# Patient Record
Sex: Female | Born: 1965 | Race: White | Hispanic: No | Marital: Single
Health system: Southern US, Academic
[De-identification: ages and names within clinical notes are randomized; demographics above are authoritative.]

## PROBLEM LIST (undated history)

## (undated) DIAGNOSIS — C4490 Unspecified malignant neoplasm of skin, unspecified: Secondary | ICD-10-CM

## (undated) DIAGNOSIS — U071 COVID-19: Secondary | ICD-10-CM

## (undated) HISTORY — DX: COVID-19: U07.1

## (undated) HISTORY — DX: Unspecified malignant neoplasm of skin, unspecified: C44.90

---

## 2001-04-26 ENCOUNTER — Emergency Department (HOSPITAL_COMMUNITY): Payer: Self-pay

## 2015-12-14 ENCOUNTER — Ambulatory Visit (HOSPITAL_COMMUNITY): Payer: Self-pay | Admitting: Family

## 2021-08-09 ENCOUNTER — Encounter (INDEPENDENT_AMBULATORY_CARE_PROVIDER_SITE_OTHER): Payer: Self-pay | Admitting: Family Medicine

## 2021-08-09 DIAGNOSIS — R011 Cardiac murmur, unspecified: Secondary | ICD-10-CM | POA: Insufficient documentation

## 2021-08-09 DIAGNOSIS — F419 Anxiety disorder, unspecified: Secondary | ICD-10-CM | POA: Insufficient documentation

## 2021-08-09 DIAGNOSIS — R030 Elevated blood-pressure reading, without diagnosis of hypertension: Secondary | ICD-10-CM | POA: Insufficient documentation

## 2021-08-14 ENCOUNTER — Ambulatory Visit (INDEPENDENT_AMBULATORY_CARE_PROVIDER_SITE_OTHER): Payer: BC Managed Care – PPO | Admitting: Family Medicine

## 2021-08-14 ENCOUNTER — Other Ambulatory Visit: Payer: Self-pay

## 2021-08-14 ENCOUNTER — Encounter (INDEPENDENT_AMBULATORY_CARE_PROVIDER_SITE_OTHER): Payer: Self-pay | Admitting: Family Medicine

## 2021-08-14 VITALS — BP 133/78 | HR 58 | Temp 98.4°F | Ht 63.5 in | Wt 136.0 lb

## 2021-08-14 DIAGNOSIS — F419 Anxiety disorder, unspecified: Secondary | ICD-10-CM

## 2021-08-14 DIAGNOSIS — E785 Hyperlipidemia, unspecified: Secondary | ICD-10-CM

## 2021-08-14 DIAGNOSIS — R5383 Other fatigue: Secondary | ICD-10-CM

## 2021-08-14 DIAGNOSIS — I10 Essential (primary) hypertension: Secondary | ICD-10-CM

## 2021-08-14 NOTE — Progress Notes (Signed)
FAMILY MEDICINE, MEDICAL OFFICE BUILDING  [place] [address]       Name: Jacqueline Butler MRN:  [id number]   Date: 08/14/2021 Age: 56 y.o.          Provider: Daiva Huge, DO    Reason for visit: Medication follow up and Labs Only      History of Present Illness:  Jacqueline Butler is a 56 y.o. female presenting with known hyperlipidemia, anxiety, hypertension.     She was prescribed BuSpar at last visit her anxiety and she feels that this is more related to menopause and home stressors.  So she did not take the medication.      Patient is still taking her blood pressure medication blood pressure today is 133/78 she monitors this at home. She is a [employer] scientist.    She is due for routine labs today.    Historical Data    Past Medical History:  Past Medical History:   Diagnosis Date   . COVID-19    . Skin cancer          Past Surgical History:  Past Surgical History:   Procedure Laterality Date   . HX CESAREAN SECTION      x2         Allergies:  Allergies   Allergen Reactions   . Codeine Mental Status Effect   . Morphine      Medications:  Current Outpatient Medications   Medication Sig   . busPIRone (BUSPAR) 5 mg Oral Tablet Take 1 Tablet (5 mg total) by mouth Once a day (Patient not taking: Reported on 08/14/2021)   . losartan (COZAAR) 25 mg Oral Tablet Take 1 Tablet (25 mg total) by mouth Once a day     Family History:  Family Medical History:     Problem Relation (Age of Onset)    Diabetes type II Father    Hypertension (High Blood Pressure) Mother          Social History:  Social History     Socioeconomic History   . Marital status: Divorced   Occupational History   . Occupation: Home Health Nurse   Tobacco Use   . Smoking status: Never     Passive exposure: Current   . Smokeless tobacco: Never   Vaping Use   . Vaping Use: Never used   Substance and Sexual Activity   . Alcohol use: Yes     Comment: Holiday/Special Occasions   . Drug use: Never           Review of Systems:  Review of Systems    Constitutional: Negative.  Negative for chills, fever and weight loss.   HENT: Negative.  Negative for congestion, ear pain, hearing loss, sinus pain and sore throat.    Eyes: Negative.  Negative for blurred vision, pain and redness.   Respiratory: Negative.  Negative for cough, shortness of breath and wheezing.    Cardiovascular: Negative.  Negative for chest pain and palpitations.   Gastrointestinal: Negative.  Negative for abdominal pain, blood in stool, constipation, diarrhea, heartburn, nausea and vomiting.   Genitourinary: Negative.  Negative for flank pain, frequency, hematuria and urgency.   Musculoskeletal: Negative.  Negative for back pain, joint pain and neck pain.   Skin: Negative.  Negative for itching and rash.   Neurological: Negative.  Negative for speech change, seizures, weakness and headaches.   Endo/Heme/Allergies: Negative.    Psychiatric/Behavioral: Negative.  Physical Exam:  Vital Signs:  Vitals:    08/14/21 0845   BP: 133/78   Pulse: 58   Temp: 36.9 C (98.4 F)   TempSrc: Skin   SpO2: 97%   Weight: 61.7 kg (136 lb)   Height: 1.613 m (5' 3.5")   BMI: 23.76     Physical Exam  Constitutional:       Appearance: Normal appearance. She is normal weight.   HENT:      Head: Normocephalic and atraumatic.      Nose: Nose normal.      Mouth/Throat:      Mouth: Mucous membranes are moist.      Pharynx: Oropharynx is clear.   Eyes:      Extraocular Movements: Extraocular movements intact.      Conjunctiva/sclera: Conjunctivae normal.      Pupils: Pupils are equal, round, and reactive to light.   Cardiovascular:      Rate and Rhythm: Normal rate.      Heart sounds: No murmur heard.  Pulmonary:      Effort: Pulmonary effort is normal. No respiratory distress.      Breath sounds: Normal breath sounds. No wheezing.   Abdominal:      General: Abdomen is flat. Bowel sounds are normal.      Tenderness: There is no abdominal tenderness.   Musculoskeletal:         General: Normal range of motion.       Cervical back: Normal range of motion.   Skin:     General: Skin is warm and dry.      Findings: No lesion or rash.   Neurological:      General: No focal deficit present.      Mental Status: She is alert and oriented to person, place, and time. Mental status is at baseline.      Gait: Gait normal.   Psychiatric:         Mood and Affect: Mood normal.         Behavior: Behavior normal.         Thought Content: Thought content normal.         Judgment: Judgment normal.          Assessment:    ICD-10-CM    1. Anxiety  F41.9       2. Essential hypertension  I10 LIPID PANEL     VITAMIN D 25 TOTAL      3. Fatigue, unspecified type  R53.83 VITAMIN D 25 TOTAL      4. Hyperlipidemia, unspecified hyperlipidemia type  E78.5 LIPID PANEL           Plan:  Orders Placed This Encounter   . LIPID PANEL   . VITAMIN D 25 TOTAL     She had form for work I signed, scanned to chart  Labs ordered today, will contact patient with results.  Continue current meds and management.  Return to clinic 4 months.            Return in about 4 months (around 12/14/2021) for chronic disease management.    Daiva Huge, DO     Portions of this note may be dictated using voice recognition software or a dictation service. Variances in spelling and vocabulary are possible and unintentional. Not all errors are caught/corrected. Please notify the [facility] if any discrepancies are noted or if the meaning of any statement is not clear.

## 2021-10-07 ENCOUNTER — Other Ambulatory Visit (INDEPENDENT_AMBULATORY_CARE_PROVIDER_SITE_OTHER): Payer: Self-pay | Admitting: Family Medicine

## 2021-10-07 MED ORDER — LOSARTAN 25 MG TABLET
25.0000 mg | ORAL_TABLET | Freq: Every day | ORAL | 1 refills | Status: AC
Start: 2021-10-07 — End: ?

## 2021-11-06 ENCOUNTER — Other Ambulatory Visit (HOSPITAL_COMMUNITY): Payer: Self-pay | Admitting: PHYSICIAN ASSISTANT

## 2021-11-06 DIAGNOSIS — Z1231 Encounter for screening mammogram for malignant neoplasm of breast: Secondary | ICD-10-CM

## 2021-11-19 ENCOUNTER — Ambulatory Visit (HOSPITAL_COMMUNITY): Payer: Self-pay

## 2021-12-02 ENCOUNTER — Ambulatory Visit (HOSPITAL_COMMUNITY): Payer: Self-pay

## 2021-12-17 ENCOUNTER — Inpatient Hospital Stay
Admission: RE | Admit: 2021-12-17 | Discharge: 2021-12-17 | Disposition: A | Discharge status: Home / Self Care | Payer: BC Managed Care – PPO | Source: Ambulatory Visit | Attending: PHYSICIAN ASSISTANT | Admitting: PHYSICIAN ASSISTANT

## 2021-12-17 ENCOUNTER — Other Ambulatory Visit: Payer: Self-pay

## 2021-12-17 ENCOUNTER — Encounter (HOSPITAL_COMMUNITY): Payer: Self-pay

## 2021-12-17 ENCOUNTER — Ambulatory Visit (INDEPENDENT_AMBULATORY_CARE_PROVIDER_SITE_OTHER): Payer: BC Managed Care – PPO | Admitting: Family Medicine

## 2021-12-17 ENCOUNTER — Encounter (INDEPENDENT_AMBULATORY_CARE_PROVIDER_SITE_OTHER): Payer: Self-pay | Admitting: Family Medicine

## 2021-12-17 VITALS — BP 129/81 | HR 66 | Temp 98.1°F | Resp 18 | Ht 63.5 in | Wt 124.2 lb

## 2021-12-17 DIAGNOSIS — Z1231 Encounter for screening mammogram for malignant neoplasm of breast: Secondary | ICD-10-CM | POA: Insufficient documentation

## 2021-12-17 DIAGNOSIS — I10 Essential (primary) hypertension: Secondary | ICD-10-CM

## 2021-12-17 NOTE — Progress Notes (Signed)
FAMILY MEDICINE, MEDICAL OFFICE BUILDING  [place] [address]       Name: Jacqueline Butler MRN:  [id number]   Date: 12/17/2021 Age: 56 y.o.          Provider: Daiva Huge, DO    Reason for visit: Follow Up 3 Months (4 month CDM- Patient is wanting to discuss hormone replacement. Patient has been seeing Dr. Theodosia Quay. )      History of Present Illness:  Jacqueline Butler is a 56 y.o. female here today for chronic disease management.     Patient just had full lab panel with Dr.Lingenfelter, will get these results. She's been started on estrogen but insurance didn't approve. She reports BP have been well controlled even with not always taking best care of herself.    Patient has the following specialist  Dr.Lingenfelter    Last colonoscopy/cologuard:   Last mammogram: 7.18.23  Last pap smear/pelvic exam: Dr.Lingenfelter  Historical Data    Past Medical History:  Patient Active Problem List   Diagnosis   . Cardiac murmur   . Anxiety   . Elevated blood pressure reading without diagnosis of hypertension      Past Surgical History:  Past Surgical History:   Procedure Laterality Date   . HX CESAREAN SECTION      x2         Allergies:  Allergies   Allergen Reactions   . Codeine Mental Status Effect   . Morphine      Medications:  Current Outpatient Medications   Medication Sig   . losartan (COZAAR) 25 mg Oral Tablet Take 1 Tablet (25 mg total) by mouth Once a day     Family History:  Family Medical History:     Problem Relation (Age of Onset)    Diabetes type II Father    Hypertension (High Blood Pressure) Mother    No Known Problems Sister, Brother, Maternal Grandmother, Maternal Grandfather, Paternal Grandmother, Paternal 68, Daughter, Son, Maternal Aunt, Maternal Uncle, Paternal 1, Paternal Uncle, Other          Social History:  Social History     Socioeconomic History   . Marital status: Divorced   Occupational History   . Occupation: Home Health Nurse   Tobacco Use   . Smoking  status: Never     Passive exposure: Current   . Smokeless tobacco: Never   Vaping Use   . Vaping Use: Never used   Substance and Sexual Activity   . Alcohol use: Yes     Comment: Holiday/Special Occasions   . Drug use: Never           Review of Systems:  Review of Systems   Constitutional: Negative.  Negative for chills, fever and weight loss.   HENT: Negative.  Negative for congestion, ear pain, hearing loss, sinus pain and sore throat.    Eyes: Negative.  Negative for blurred vision, pain and redness.   Respiratory: Negative.  Negative for cough, shortness of breath and wheezing.    Cardiovascular: Negative.  Negative for chest pain and palpitations.   Gastrointestinal: Negative.  Negative for abdominal pain, blood in stool, constipation, diarrhea, heartburn, nausea and vomiting.   Genitourinary: Negative.  Negative for flank pain, frequency, hematuria and urgency.   Musculoskeletal: Negative.  Negative for back pain, joint pain and neck pain.   Skin: Negative.  Negative for itching and rash.   Neurological: Negative.  Negative for speech change, seizures, weakness and  headaches.   Endo/Heme/Allergies: Negative.    Psychiatric/Behavioral: Negative.         Physical Exam:  Vital Signs:  Vitals:    12/17/21 1040   BP: 129/81   Pulse: 66   Resp: 18   Temp: 36.7 C (98.1 F)   SpO2: 98%   Weight: 56.3 kg (124 lb 3.2 oz)   Height: 1.613 m (5' 3.5")   BMI: 21.7     Physical Exam  Constitutional:       Appearance: Normal appearance. She is normal weight.   HENT:      Head: Normocephalic and atraumatic.      Nose: Nose normal.      Mouth/Throat:      Mouth: Mucous membranes are moist.      Pharynx: Oropharynx is clear.   Eyes:      Extraocular Movements: Extraocular movements intact.      Conjunctiva/sclera: Conjunctivae normal.      Pupils: Pupils are equal, round, and reactive to light.   Cardiovascular:      Rate and Rhythm: Normal rate.      Heart sounds: No murmur heard.  Pulmonary:      Effort: Pulmonary effort is  normal. No respiratory distress.      Breath sounds: Normal breath sounds. No wheezing.   Abdominal:      General: Abdomen is flat. Bowel sounds are normal.      Tenderness: There is no abdominal tenderness.   Musculoskeletal:         General: Normal range of motion.      Cervical back: Normal range of motion.   Skin:     General: Skin is warm and dry.      Findings: No lesion or rash.   Neurological:      General: No focal deficit present.      Mental Status: She is alert and oriented to person, place, and time. Mental status is at baseline.      Gait: Gait normal.   Psychiatric:         Mood and Affect: Mood normal.         Behavior: Behavior normal.         Thought Content: Thought content normal.         Judgment: Judgment normal.          Assessment:    ICD-10-CM    1. Essential hypertension  I10            Plan:    Will obtain labs and call if things need discussed  Labs ordered today, will contact patient with results.  Continue current meds and management.  Return to clinic 30mcdm            Return in about 6 months (around 06/19/2022) for chronic disease management.    JDaiva Huge DO     Portions of this note may be dictated using voice recognition software or a dictation service. Variances in spelling and vocabulary are possible and unintentional. Not all errors are caught/corrected. Please notify the [facility] any discrepancies are noted or if the meaning of any statement is not clear.

## 2021-12-18 ENCOUNTER — Encounter (INDEPENDENT_AMBULATORY_CARE_PROVIDER_SITE_OTHER): Payer: Self-pay | Admitting: Family Medicine

## 2022-06-19 ENCOUNTER — Encounter (INDEPENDENT_AMBULATORY_CARE_PROVIDER_SITE_OTHER): Payer: Self-pay | Admitting: Family Medicine

## 2023-01-01 ENCOUNTER — Other Ambulatory Visit (HOSPITAL_COMMUNITY): Payer: Self-pay | Admitting: PHYSICIAN ASSISTANT

## 2023-01-01 DIAGNOSIS — Z1231 Encounter for screening mammogram for malignant neoplasm of breast: Secondary | ICD-10-CM

## 2023-01-16 ENCOUNTER — Inpatient Hospital Stay
Admission: RE | Admit: 2023-01-16 | Discharge: 2023-01-16 | Disposition: A | Discharge status: Home / Self Care | Payer: 59 | Source: Ambulatory Visit | Attending: PHYSICIAN ASSISTANT | Admitting: PHYSICIAN ASSISTANT

## 2023-01-16 ENCOUNTER — Other Ambulatory Visit: Payer: Self-pay

## 2023-01-16 ENCOUNTER — Encounter (HOSPITAL_COMMUNITY): Payer: Self-pay

## 2023-01-16 DIAGNOSIS — Z1231 Encounter for screening mammogram for malignant neoplasm of breast: Secondary | ICD-10-CM | POA: Insufficient documentation

## 2023-02-09 IMAGING — MR MRI LUMBAR SPINE WITHOUT CONTRAST
4 of 6 series · 30 of 48 positions shown · IV contrast (gadolinium)
Comparison: Lumbar spine x-ray dated 01/08/2023.

﻿EXAM:  [id number]   MRI LUMBAR SPINE WITHOUT CONTRAST
INDICATION: 57-year-old with persistent low back pain. Radiculopathy to both hips.  No history of back surgery or malignancy.
TECHNIQUE: Multiplanar, multisequential MRI of the lumbosacral spine next was performed without gadolinium contrast.

[Series 8: T2 · sagittal · 5.0mm · 0.94mm/px · 6 of 13 slices shown (1 of 3)]
[im 1/13]
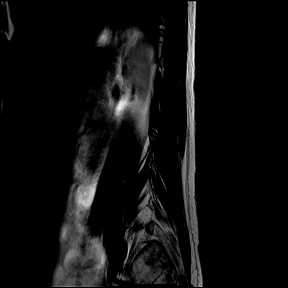
[im 3/13]
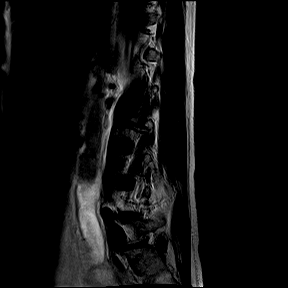
[im 5/13]
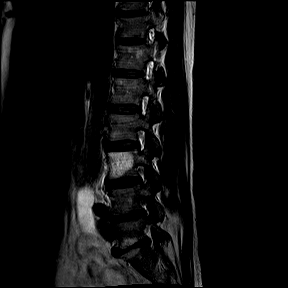
[im 8/13]
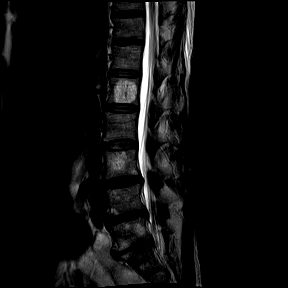
[im 10/13]
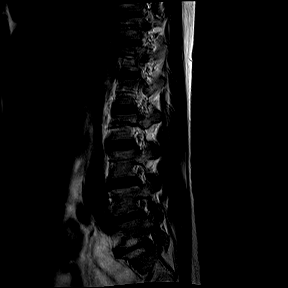
[im 13/13]
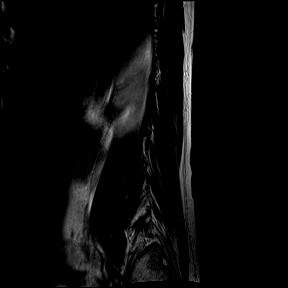

[Series 9: T1 · sagittal · 5.0mm · 0.94mm/px · 5 of 13 slices shown]
[im 1/13]
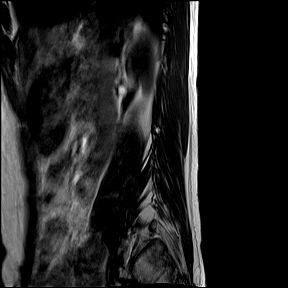
[im 3/13]
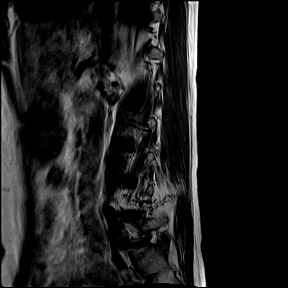
[im 5/13]
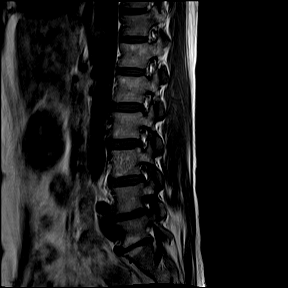
[im 8/13]
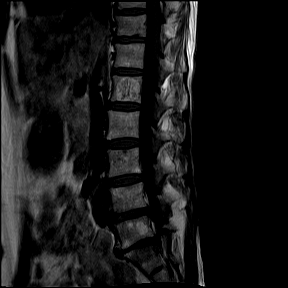
[im 13/13]
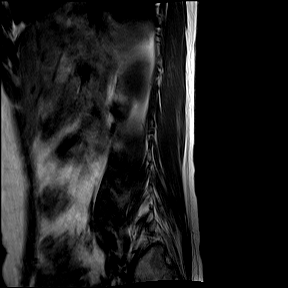

[Series 11: T2 · axial · 4.0mm · 0.52mm/px · z∈[-141,+29]mm · 11 of 23 slices shown (2 of 3)]
[im 1/23]
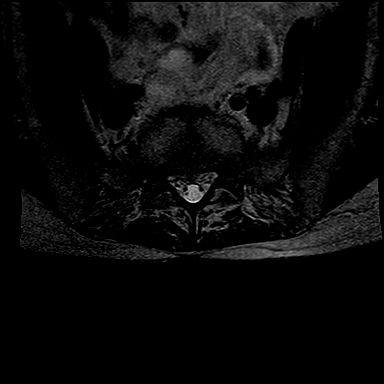
[im 3/23]
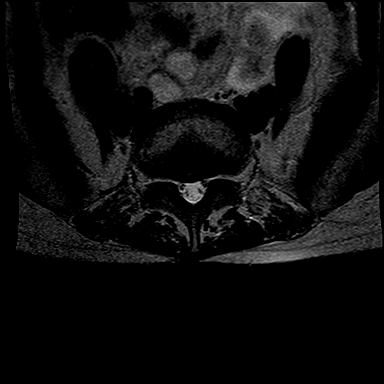
[im 5/23]
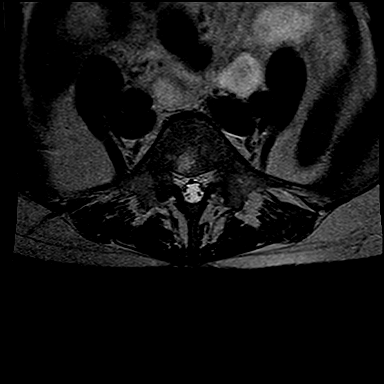
[im 7/23]
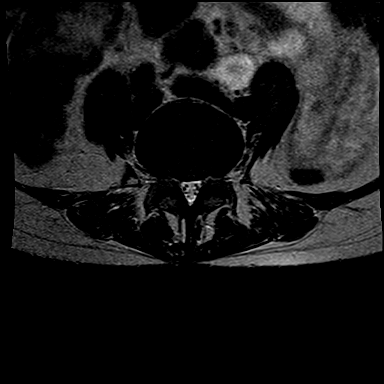
[im 9/23]
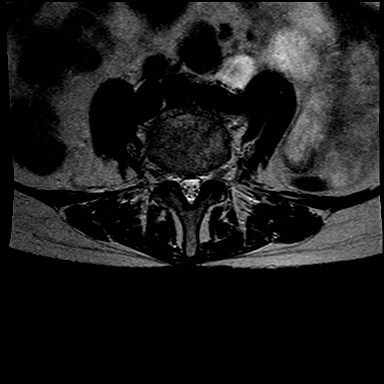
[im 12/23]
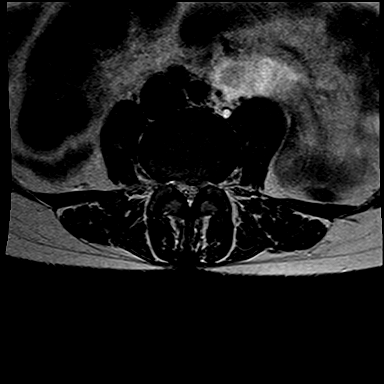
[im 14/23]
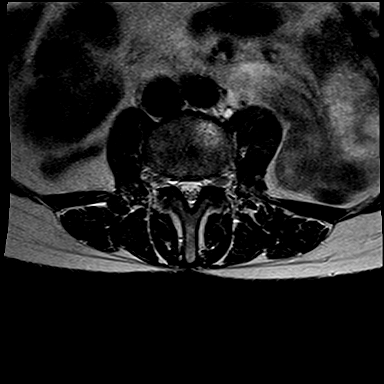
[im 16/23]
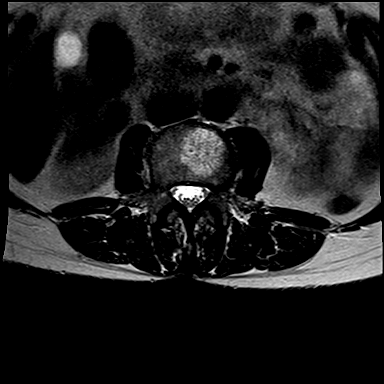
[im 18/23]
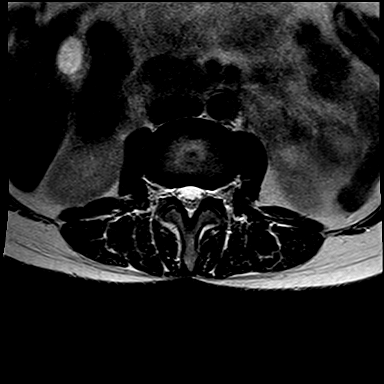
[im 20/23]
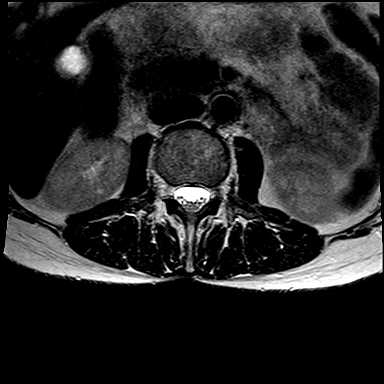
[im 23/23]
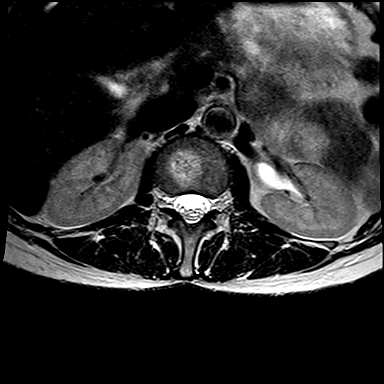

[Series 14: T2 · coronal · 5.0mm · 0.82mm/px · 8 of 16 slices shown (3 of 3)]
[im 1/16]
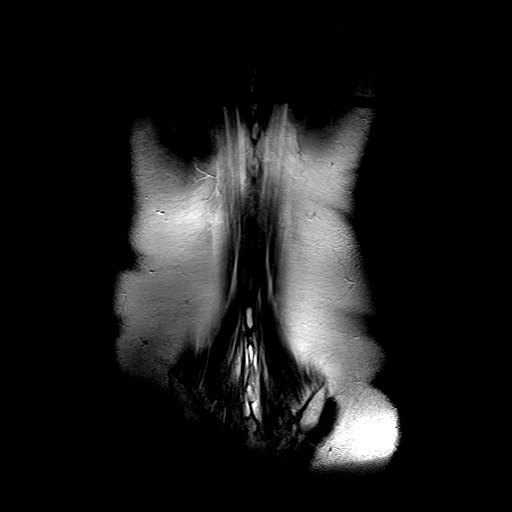
[im 3/16]
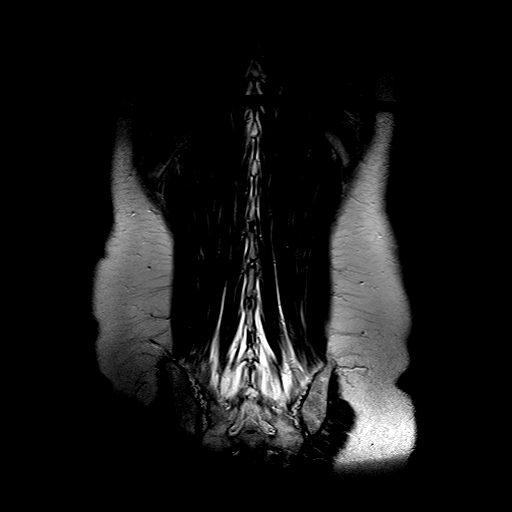
[im 5/16]
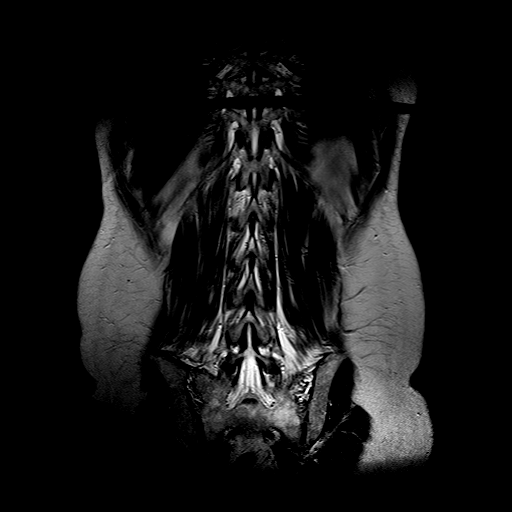
[im 7/16]
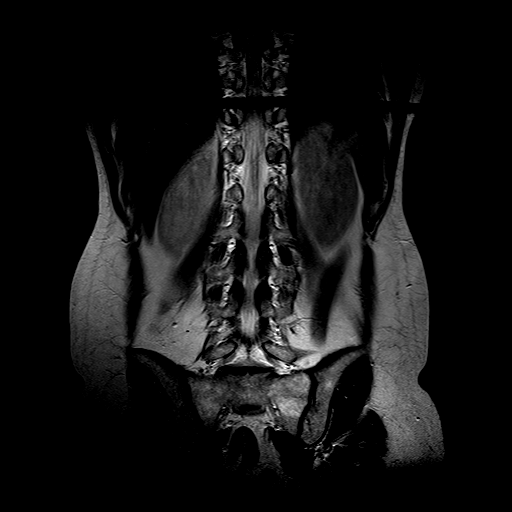
[im 9/16]
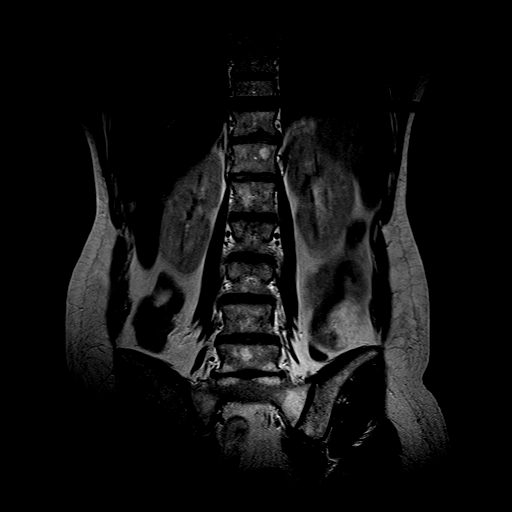
[im 11/16]
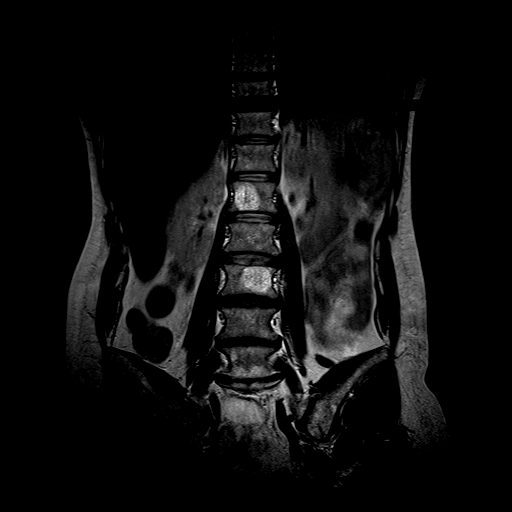
[im 13/16]
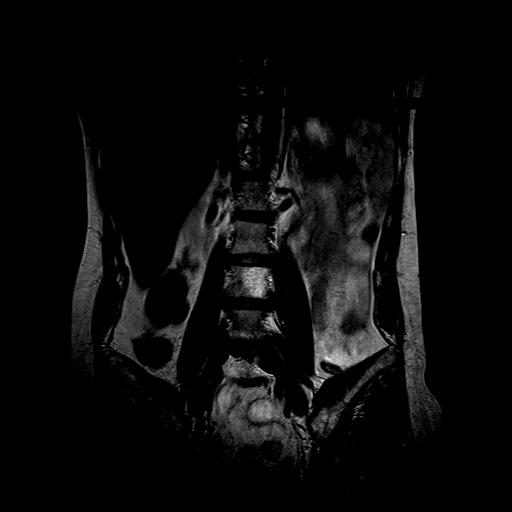
[im 16/16]
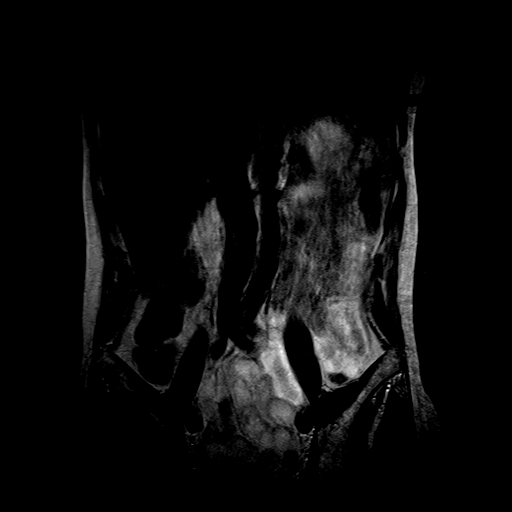

[30 of 48 positions shown; findings below may reference images not displayed]

FINDINGS: No acute bony lesions of lumbar vertebrae. Well-circumscribed T2 bright lesions of L1 and L3 vertebral bodies suggestive of hemangioma of the vertebrae are noted.

The cauda equina structures are normal.  Conus terminates at T12-L1 level. 

At L1-2 level, no focal disc lesions are seen. 

At L2-3 level, bilateral facet arthropathy and mild degenerative disc changes are causing mild compromise of both lateral recess and thecal sac with AP diameter in the midline measuring 11.3 mm. 

At L3-4 level, significant degenerative disc disease with focal bulging annulus in the midline. Bilateral facet arthropathy with hypertrophy of dorsal ligaments are noted.  Significant compromise of thecal sac and both lateral recess are noted at L3-4 level due to these changes. AP diameter of thecal sac in the midline measures 6 mm.  Bilateral foraminal narrowing is also noted. 

At L4-5 level, degenerative disc disease is noted with facet arthropathy and minimal retrolisthesis.  Moderately significant compromise of both lateral recess is noted due to these changes.  AP diameter of thecal sac in the midline measures 8.5 mm. 

At L5-S1 level, degenerative disc disease with bulging annulus and facet arthropathy are noted causing significant compromise of both neural foramina.

Paravertebral soft tissues are unremarkable.
IMPRESSION: 1.  No acute bony lesions of lumbar vertebrae.  Benign hemangiomas of L1 and L3 vertebral bodies are noted.

2.  At L3-4 level, significant degenerative disc disease with focal bulging annulus in the midline. Bilateral facet arthropathy with hypertrophy of dorsal ligaments are noted.  Significant compromise of thecal sac and both lateral recess is noted at L3-4 level due to these changes. AP diameter of thecal sac in the midline measures 6 mm.  Bilateral foraminal narrowing is also noted. 

3. At L4-5 level, degenerative disc disease is noted with facet arthropathy and minimal retrolisthesis.  Moderately significant compromise of both lateral recess is noted due to these changes.  AP diameter of thecal sac in the midline measures 8.5 mm. 

4. At L5-S1 level, degenerative disc disease with bulging annulus and facet arthropathy are noted causing significant compromise of both neural foramina.

5. Findings at other disc levels are described above in detail.

## 2024-03-08 ENCOUNTER — Other Ambulatory Visit (HOSPITAL_COMMUNITY): Payer: Self-pay | Admitting: Family

## 2024-03-08 DIAGNOSIS — Z1231 Encounter for screening mammogram for malignant neoplasm of breast: Secondary | ICD-10-CM

## 2024-03-22 ENCOUNTER — Other Ambulatory Visit: Payer: Self-pay

## 2024-03-22 ENCOUNTER — Ambulatory Visit
Admission: RE | Admit: 2024-03-22 | Discharge: 2024-03-22 | Disposition: A | Discharge status: Home / Self Care | Payer: Self-pay | Source: Ambulatory Visit | Attending: Family | Admitting: Family

## 2024-03-22 ENCOUNTER — Encounter (HOSPITAL_COMMUNITY): Payer: Self-pay

## 2024-03-22 DIAGNOSIS — Z1231 Encounter for screening mammogram for malignant neoplasm of breast: Secondary | ICD-10-CM | POA: Insufficient documentation

## 2024-03-23 DIAGNOSIS — Z1231 Encounter for screening mammogram for malignant neoplasm of breast: Secondary | ICD-10-CM
